# Patient Record
Sex: Female | Born: 1940 | Race: White | Hispanic: No | Marital: Married | Smoking: Never smoker
Health system: Southern US, Community
[De-identification: ages and names within clinical notes are randomized; demographics above are authoritative.]

## PROBLEM LIST (undated history)

## (undated) DIAGNOSIS — I10 Essential (primary) hypertension: Secondary | ICD-10-CM

## (undated) DIAGNOSIS — N200 Calculus of kidney: Secondary | ICD-10-CM

## (undated) HISTORY — PX: ANKLE SURGERY: SHX546

## (undated) HISTORY — DX: Essential (primary) hypertension: I10

## (undated) HISTORY — PX: ABDOMINAL HYSTERECTOMY: SHX81

## (undated) HISTORY — PX: CHOLECYSTECTOMY: SHX55

---

## 1998-02-19 ENCOUNTER — Other Ambulatory Visit: Admission: RE | Admit: 1998-02-19 | Discharge: 1998-02-19 | Payer: Self-pay | Admitting: Obstetrics and Gynecology

## 1998-07-28 ENCOUNTER — Emergency Department (HOSPITAL_COMMUNITY): Admission: EM | Admit: 1998-07-28 | Discharge: 1998-07-28 | Payer: Self-pay | Admitting: Internal Medicine

## 1999-02-07 ENCOUNTER — Ambulatory Visit (HOSPITAL_COMMUNITY): Admission: RE | Admit: 1999-02-07 | Discharge: 1999-02-07 | Payer: Self-pay | Admitting: Family Medicine

## 1999-02-07 ENCOUNTER — Encounter: Payer: Self-pay | Admitting: Family Medicine

## 1999-03-31 ENCOUNTER — Observation Stay (HOSPITAL_COMMUNITY): Admission: RE | Admit: 1999-03-31 | Discharge: 1999-04-01 | Payer: Self-pay | Admitting: Surgery

## 1999-04-21 ENCOUNTER — Emergency Department (HOSPITAL_COMMUNITY): Admission: EM | Admit: 1999-04-21 | Discharge: 1999-04-21 | Payer: Self-pay | Admitting: Emergency Medicine

## 1999-04-21 ENCOUNTER — Encounter: Payer: Self-pay | Admitting: Emergency Medicine

## 1999-05-28 ENCOUNTER — Other Ambulatory Visit: Admission: RE | Admit: 1999-05-28 | Discharge: 1999-05-28 | Payer: Self-pay | Admitting: Obstetrics and Gynecology

## 2000-01-21 ENCOUNTER — Emergency Department (HOSPITAL_COMMUNITY): Admission: EM | Admit: 2000-01-21 | Discharge: 2000-01-21 | Payer: Self-pay | Admitting: Emergency Medicine

## 2000-01-21 ENCOUNTER — Encounter: Payer: Self-pay | Admitting: Emergency Medicine

## 2000-02-29 ENCOUNTER — Encounter: Payer: Self-pay | Admitting: Emergency Medicine

## 2000-02-29 ENCOUNTER — Emergency Department (HOSPITAL_COMMUNITY): Admission: EM | Admit: 2000-02-29 | Discharge: 2000-02-29 | Payer: Self-pay | Admitting: Internal Medicine

## 2000-03-01 ENCOUNTER — Observation Stay (HOSPITAL_COMMUNITY): Admission: AD | Admit: 2000-03-01 | Discharge: 2000-03-02 | Payer: Self-pay | Admitting: Urology

## 2000-08-19 ENCOUNTER — Other Ambulatory Visit: Admission: RE | Admit: 2000-08-19 | Discharge: 2000-08-19 | Payer: Self-pay | Admitting: Obstetrics and Gynecology

## 2000-12-28 ENCOUNTER — Inpatient Hospital Stay (HOSPITAL_COMMUNITY): Admission: RE | Admit: 2000-12-28 | Discharge: 2000-12-30 | Payer: Self-pay | Admitting: Orthopedic Surgery

## 2000-12-28 ENCOUNTER — Encounter: Payer: Self-pay | Admitting: Orthopedic Surgery

## 2001-02-14 ENCOUNTER — Encounter: Admission: RE | Admit: 2001-02-14 | Discharge: 2001-03-10 | Payer: Self-pay | Admitting: Orthopedic Surgery

## 2001-09-02 ENCOUNTER — Other Ambulatory Visit: Admission: RE | Admit: 2001-09-02 | Discharge: 2001-09-02 | Payer: Self-pay | Admitting: Obstetrics and Gynecology

## 2001-09-02 ENCOUNTER — Encounter: Admission: RE | Admit: 2001-09-02 | Discharge: 2001-09-02 | Payer: Self-pay | Admitting: Obstetrics and Gynecology

## 2001-09-02 ENCOUNTER — Encounter: Payer: Self-pay | Admitting: Obstetrics and Gynecology

## 2003-04-06 ENCOUNTER — Ambulatory Visit (HOSPITAL_COMMUNITY): Admission: RE | Admit: 2003-04-06 | Discharge: 2003-04-06 | Payer: Self-pay | Admitting: Family Medicine

## 2003-04-06 ENCOUNTER — Encounter: Payer: Self-pay | Admitting: Family Medicine

## 2003-04-16 ENCOUNTER — Ambulatory Visit (HOSPITAL_COMMUNITY): Admission: RE | Admit: 2003-04-16 | Discharge: 2003-04-16 | Payer: Self-pay | Admitting: *Deleted

## 2003-04-16 ENCOUNTER — Encounter: Payer: Self-pay | Admitting: Family Medicine

## 2010-12-07 ENCOUNTER — Emergency Department (HOSPITAL_COMMUNITY): Payer: Medicare Other

## 2010-12-07 ENCOUNTER — Emergency Department (HOSPITAL_COMMUNITY)
Admission: EM | Admit: 2010-12-07 | Discharge: 2010-12-07 | Disposition: A | Discharge status: Home / Self Care | Payer: Medicare Other | Attending: Emergency Medicine | Admitting: Emergency Medicine

## 2010-12-07 DIAGNOSIS — S8010XA Contusion of unspecified lower leg, initial encounter: Secondary | ICD-10-CM | POA: Insufficient documentation

## 2010-12-07 DIAGNOSIS — M79609 Pain in unspecified limb: Secondary | ICD-10-CM | POA: Insufficient documentation

## 2010-12-07 DIAGNOSIS — Y92009 Unspecified place in unspecified non-institutional (private) residence as the place of occurrence of the external cause: Secondary | ICD-10-CM | POA: Insufficient documentation

## 2010-12-07 DIAGNOSIS — IMO0002 Reserved for concepts with insufficient information to code with codable children: Secondary | ICD-10-CM | POA: Insufficient documentation

## 2010-12-17 ENCOUNTER — Emergency Department (HOSPITAL_COMMUNITY)
Admission: EM | Admit: 2010-12-17 | Discharge: 2010-12-17 | Disposition: A | Discharge status: Home / Self Care | Payer: Medicare Other | Attending: Emergency Medicine | Admitting: Emergency Medicine

## 2010-12-17 DIAGNOSIS — L03119 Cellulitis of unspecified part of limb: Secondary | ICD-10-CM | POA: Insufficient documentation

## 2010-12-17 DIAGNOSIS — S8010XA Contusion of unspecified lower leg, initial encounter: Secondary | ICD-10-CM | POA: Insufficient documentation

## 2010-12-17 DIAGNOSIS — L02419 Cutaneous abscess of limb, unspecified: Secondary | ICD-10-CM | POA: Insufficient documentation

## 2010-12-17 DIAGNOSIS — W208XXA Other cause of strike by thrown, projected or falling object, initial encounter: Secondary | ICD-10-CM | POA: Insufficient documentation

## 2010-12-17 LAB — CBC
Hemoglobin: 11.7 g/dL — ABNORMAL LOW (ref 12.0–15.0)
MCV: 89.9 fL (ref 78.0–100.0)
RBC: 4.07 MIL/uL (ref 3.87–5.11)
WBC: 4.7 10*3/uL (ref 4.0–10.5)

## 2010-12-17 LAB — DIFFERENTIAL
Basophils Relative: 1 % (ref 0–1)
Lymphocytes Relative: 31 % (ref 12–46)
Lymphs Abs: 1.5 10*3/uL (ref 0.7–4.0)
Monocytes Relative: 11 % (ref 3–12)
Neutro Abs: 2.6 10*3/uL (ref 1.7–7.7)

## 2012-05-30 IMAGING — CR DG TIBIA/FIBULA 2V*R*
4 series · 4 of 4 positions shown · non-contrast
Comparison: None.

CLINICAL DATA: Trauma, pain

RIGHT TIBIA AND FIBULA - 2 VIEW

[t tib/fib ap right (1 of 2)]
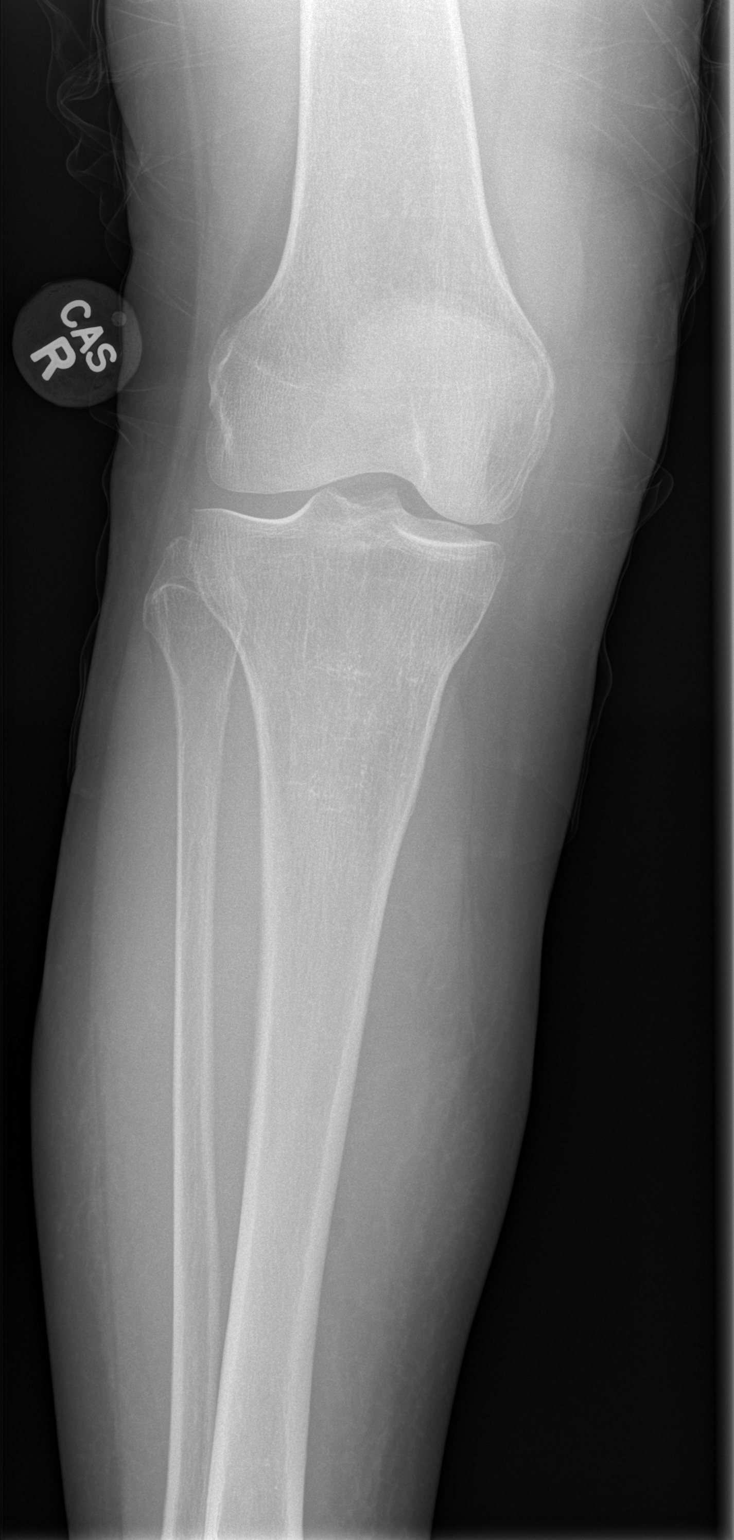

[t tib/fib lat right (1 of 2)]
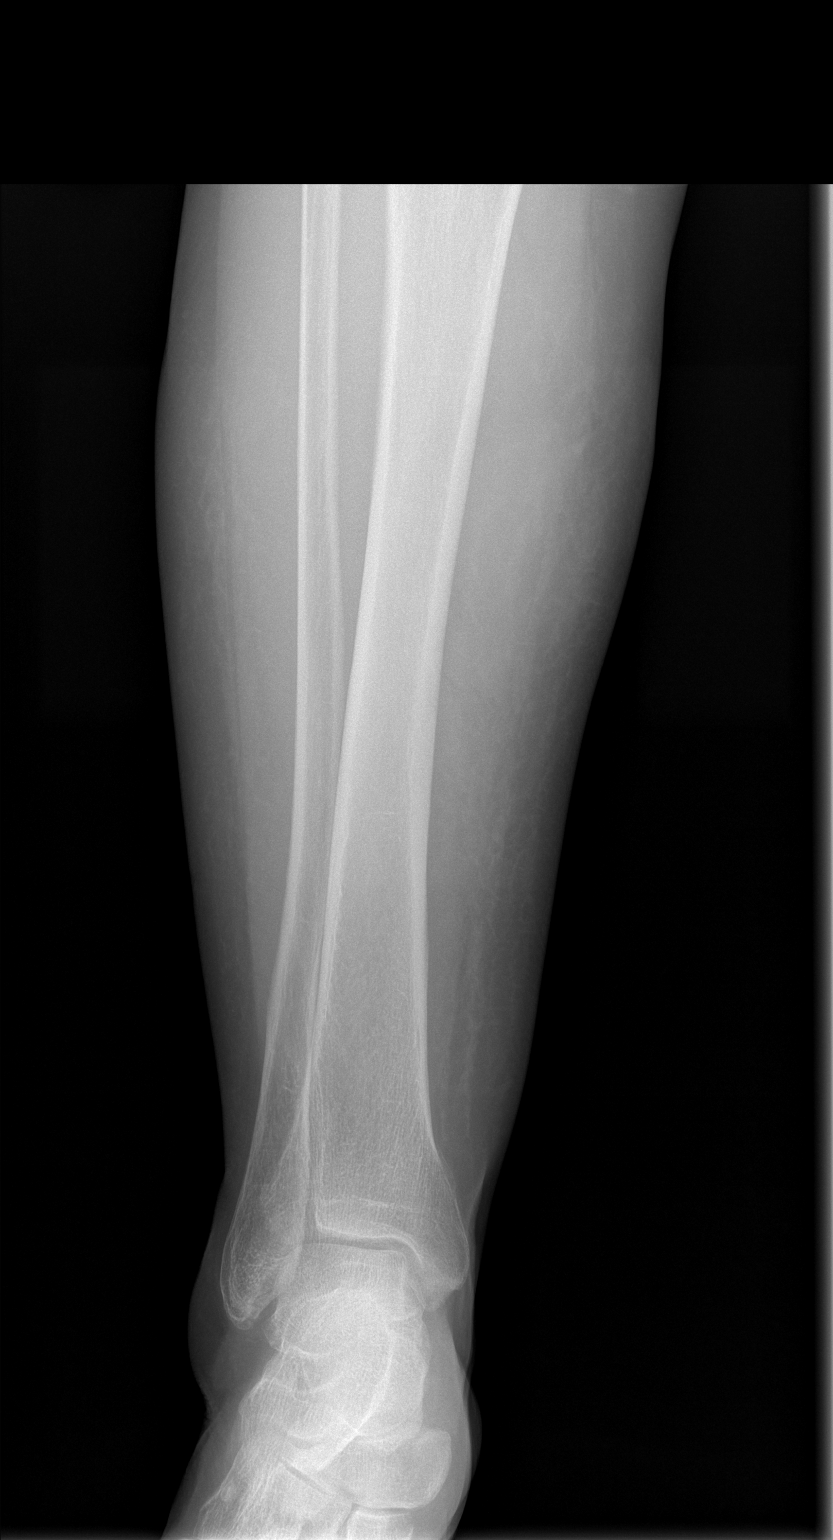

[t tib/fib ap right (2 of 2)]
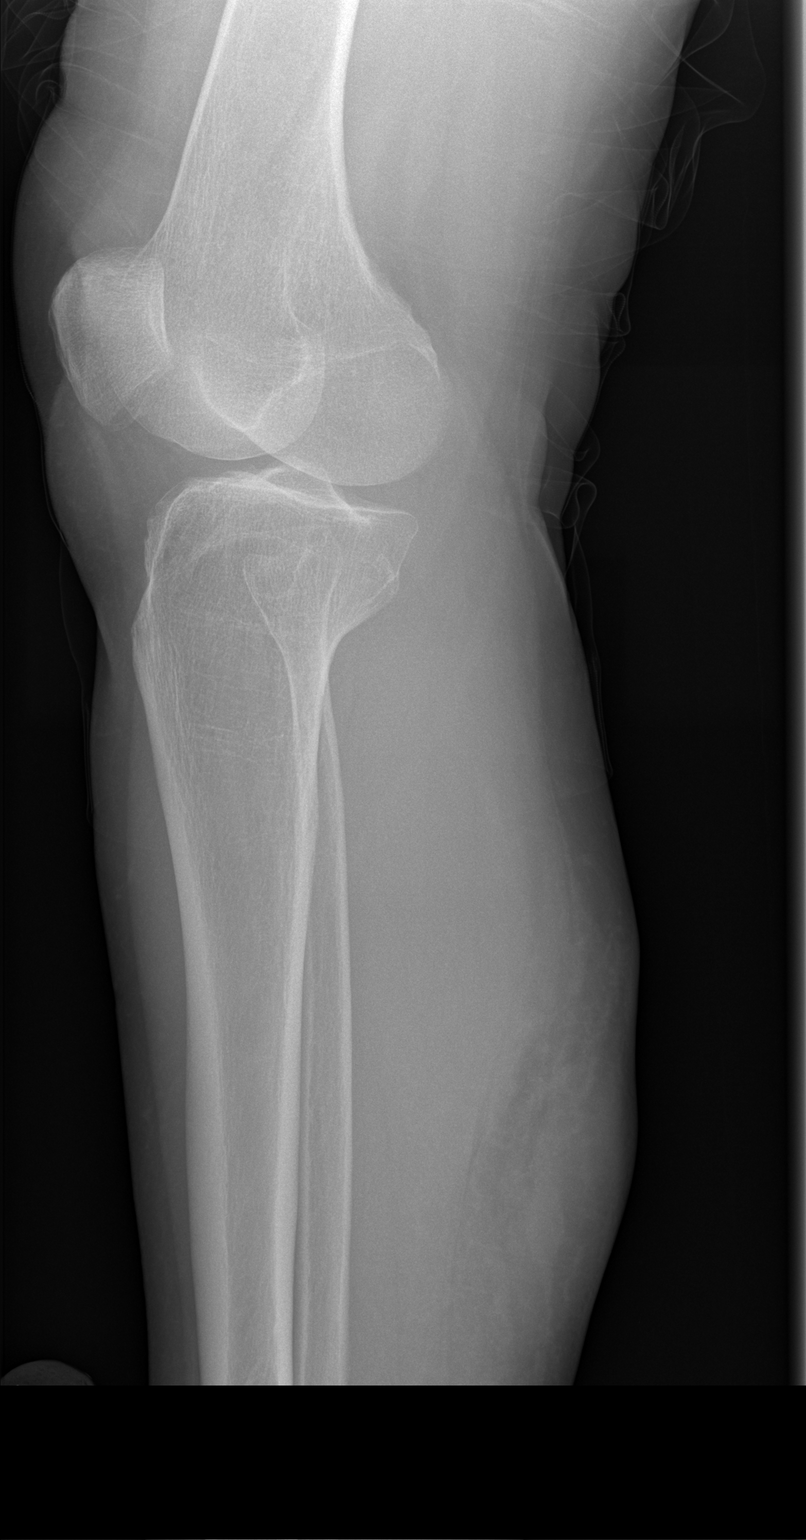

[t tib/fib lat right (2 of 2)]
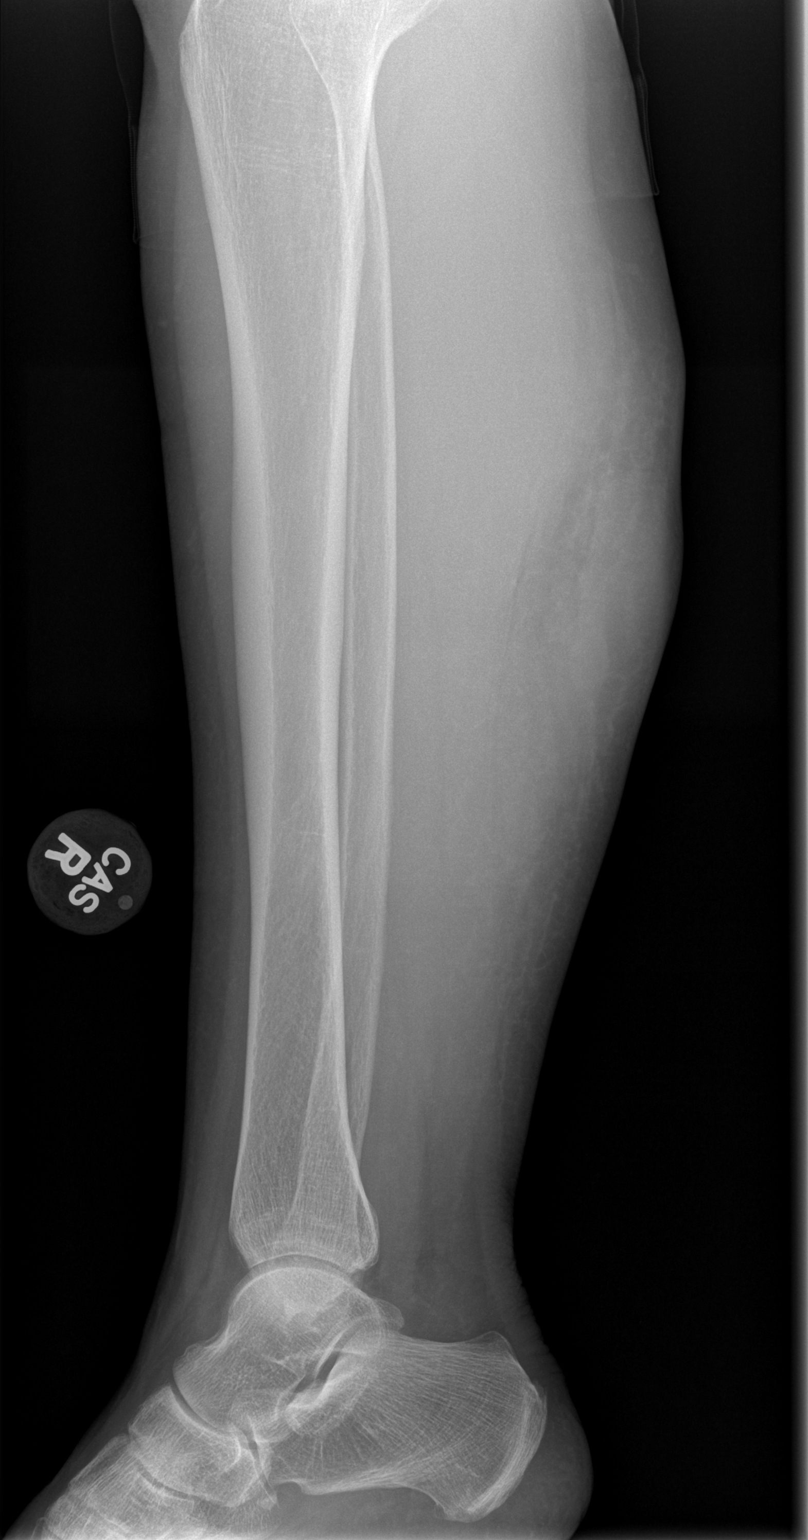

[4 of 4 positions shown; findings below may reference images not displayed]

FINDINGS: Soft tissue swelling noted.  Intact right tibia and
fibula.  Normal alignment.  No displaced fracture.
IMPRESSION: Soft tissue swelling.  No acute osseous finding

## 2014-08-28 ENCOUNTER — Emergency Department (HOSPITAL_COMMUNITY)
Admission: EM | Admit: 2014-08-28 | Discharge: 2014-08-28 | Disposition: A | Discharge status: Home / Self Care | Payer: Medicare Other | Attending: Emergency Medicine | Admitting: Emergency Medicine

## 2014-08-28 ENCOUNTER — Encounter (HOSPITAL_COMMUNITY): Payer: Self-pay

## 2014-08-28 DIAGNOSIS — Z9049 Acquired absence of other specified parts of digestive tract: Secondary | ICD-10-CM | POA: Diagnosis not present

## 2014-08-28 DIAGNOSIS — R42 Dizziness and giddiness: Secondary | ICD-10-CM | POA: Diagnosis present

## 2014-08-28 DIAGNOSIS — R531 Weakness: Secondary | ICD-10-CM | POA: Diagnosis not present

## 2014-08-28 DIAGNOSIS — Z87442 Personal history of urinary calculi: Secondary | ICD-10-CM | POA: Insufficient documentation

## 2014-08-28 HISTORY — DX: Calculus of kidney: N20.0

## 2014-08-28 LAB — TROPONIN I: Troponin I: <0.03 ng/mL (ref <0.031)

## 2014-08-28 LAB — URINE MICROSCOPIC-ADD ON

## 2014-08-28 LAB — CBC WITH DIFFERENTIAL/PLATELET
BASOS ABS: 0 10*3/uL (ref 0.0–0.1)
Basophils Relative: 0 % (ref 0–1)
Eosinophils Absolute: 0 10*3/uL (ref 0.0–0.7)
Eosinophils Relative: 0 % (ref 0–5)
HCT: 42.8 % (ref 36.0–46.0)
HEMOGLOBIN: 14 g/dL (ref 12.0–15.0)
LYMPHS PCT: 8 % — AB (ref 12–46)
Lymphs Abs: 0.6 10*3/uL — ABNORMAL LOW (ref 0.7–4.0)
MCH: 29.7 pg (ref 26.0–34.0)
MCHC: 32.7 g/dL (ref 30.0–36.0)
MCV: 90.9 fL (ref 78.0–100.0)
Monocytes Absolute: 0.5 10*3/uL (ref 0.1–1.0)
Monocytes Relative: 7 % (ref 3–12)
NEUTROS PCT: 85 % — AB (ref 43–77)
Neutro Abs: 6.8 10*3/uL (ref 1.7–7.7)
Platelets: 217 10*3/uL (ref 150–400)
RBC: 4.71 MIL/uL (ref 3.87–5.11)
RDW: 13.8 % (ref 11.5–15.5)
WBC: 8 10*3/uL (ref 4.0–10.5)

## 2014-08-28 LAB — URINALYSIS, ROUTINE W REFLEX MICROSCOPIC
Bilirubin Urine: NEGATIVE
Glucose, UA: NEGATIVE mg/dL
Hgb urine dipstick: NEGATIVE
Ketones, ur: NEGATIVE mg/dL
NITRITE: NEGATIVE
PH: 8 (ref 5.0–8.0)
Protein, ur: NEGATIVE mg/dL
Specific Gravity, Urine: 1.01 (ref 1.005–1.030)
Urobilinogen, UA: 0.2 mg/dL (ref 0.0–1.0)

## 2014-08-28 LAB — COMPREHENSIVE METABOLIC PANEL
ALT: 21 U/L (ref 0–35)
AST: 25 U/L (ref 0–37)
Albumin: 4.2 g/dL (ref 3.5–5.2)
Alkaline Phosphatase: 101 U/L (ref 39–117)
Anion gap: 8 (ref 5–15)
BUN: 13 mg/dL (ref 6–23)
CO2: 22 mmol/L (ref 19–32)
CREATININE: 0.89 mg/dL (ref 0.50–1.10)
Calcium: 8.8 mg/dL (ref 8.4–10.5)
Chloride: 108 mmol/L (ref 96–112)
GFR calc Af Amer: 73 mL/min — ABNORMAL LOW (ref >90)
GFR, EST NON AFRICAN AMERICAN: 63 mL/min — AB (ref >90)
GLUCOSE: 156 mg/dL — AB (ref 70–99)
Potassium: 3.7 mmol/L (ref 3.5–5.1)
Sodium: 138 mmol/L (ref 135–145)
TOTAL PROTEIN: 7.3 g/dL (ref 6.0–8.3)
Total Bilirubin: 0.6 mg/dL (ref 0.3–1.2)

## 2014-08-28 MED ORDER — DIPHENHYDRAMINE HCL 50 MG/ML IJ SOLN
12.5000 mg | Freq: Once | INTRAMUSCULAR | Status: AC
  Administered 2014-08-28: 12.5 mg via INTRAVENOUS
  Filled 2014-08-28: qty 1

## 2014-08-28 MED ORDER — MECLIZINE HCL 25 MG PO TABS: 25.0000 mg | ORAL_TABLET | Freq: Four times a day (QID) | ORAL | Status: DC

## 2014-08-28 MED ORDER — METOCLOPRAMIDE HCL 5 MG/ML IJ SOLN
5.0000 mg | Freq: Once | INTRAMUSCULAR | Status: AC
  Administered 2014-08-28: 5 mg via INTRAVENOUS
  Filled 2014-08-28: qty 2

## 2014-08-28 MED ORDER — SODIUM CHLORIDE 0.9 % IV BOLUS (SEPSIS)
1000.0000 mL | Freq: Once | INTRAVENOUS | Status: AC
  Administered 2014-08-28: 1000 mL via INTRAVENOUS

## 2014-08-28 MED ORDER — LORAZEPAM 2 MG/ML IJ SOLN
0.5000 mg | Freq: Once | INTRAMUSCULAR | Status: AC
  Administered 2014-08-28: 0.5 mg via INTRAVENOUS
  Filled 2014-08-28: qty 1

## 2014-08-28 MED ORDER — SODIUM CHLORIDE 0.9 % IV SOLN
INTRAVENOUS | Status: DC
  Administered 2014-08-28: 09:00:00 via INTRAVENOUS

## 2014-08-28 NOTE — ED Notes (Signed)
Bed: WA23 Expected date:  Expected time:  Means of arrival:  Comments: EMS-weak 

## 2014-08-28 NOTE — ED Notes (Signed)
Per EMS, pt from home.  Pt got up this am to take a shower.  Pt came back to bed and had a generalized weakness, flushed feeling and vertigo.  Pt had no fall.  Alert and oriented.  Neuro assessment wnl by EMS.  All symptoms started 35 minutes ago.  No unilateral deficits.  Speech clear.  No numbness/tingling.  C/O lightheaded and dizzy.  Nausea dry heave.  20g Rt hand.  cbg 171, 190/103 - now 148/72, hr 92, resp 20, 4l per [place] sats 95%.

## 2014-08-28 NOTE — ED Provider Notes (Signed)
CSN: [id number]     Arrival date & time 08/28/14  4098 History   First MD Initiated Contact with Patient 08/28/14 0827     Chief Complaint  Patient presents with  . Weakness  . Dizziness     (Consider location/radiation/quality/duration/timing/severity/associated sxs/prior Treatment) HPI Comments: Patient with acute onset of dizziness and weakness that began this morning which went to stand up. Was at her baseline state of health yesterday. Today she denies any associated chest pain or shortness of breath. No abdominal pain. No headaches. No confusion. Denies any unilateral weakness. No black or bloody stools. No prior history of same. No recent URI symptoms. Symptoms better with sitting. No prior history of same. Patient does not take any regular medications. Symptoms are slightly worse with head movement and she denies any ear pain. EMS called and patient transported here  Patient is a 74 y.o. female presenting with weakness and dizziness. The history is provided by the patient and a relative.  Weakness  Dizziness   Past Medical History  Diagnosis Date  . Kidney stones    Past Surgical History  Procedure Laterality Date  . Cholecystectomy    . Ankle surgery    . Abdominal hysterectomy     History reviewed. No pertinent family history. History  Substance Use Topics  . Smoking status: Never Smoker   . Smokeless tobacco: Not on file  . Alcohol Use: No   OB History    No data available     Review of Systems  Neurological: Positive for dizziness and weakness.  All other systems reviewed and are negative.     Allergies  Review of patient's allergies indicates no known allergies.  Home Medications   Prior to Admission medications   Not on File   BP 126/86 mmHg  Pulse 86  Temp(Src) 98.1 F (36.7 C) (Oral)  Resp 21  SpO2 97% Physical Exam  Constitutional: She is oriented to person, place, and time. She appears well-developed and well-nourished.  Non-toxic  appearance. No distress.  HENT:  Head: Normocephalic and atraumatic.  Eyes: Conjunctivae, EOM and lids are normal. Pupils are equal, round, and reactive to light.  Neck: Normal range of motion. Neck supple. No tracheal deviation present. No thyroid mass present.  Cardiovascular: Normal rate, regular rhythm and normal heart sounds.  Exam reveals no gallop.   No murmur heard. Pulmonary/Chest: Effort normal and breath sounds normal. No stridor. No respiratory distress. She has no decreased breath sounds. She has no wheezes. She has no rhonchi. She has no rales.  Abdominal: Soft. Normal appearance and bowel sounds are normal. She exhibits no distension. There is no tenderness. There is no rebound and no CVA tenderness.  Musculoskeletal: Normal range of motion. She exhibits no edema or tenderness.  Neurological: She is alert and oriented to person, place, and time. She has normal strength. No cranial nerve deficit or sensory deficit. Coordination and gait normal. GCS eye subscore is 4. GCS verbal subscore is 5. GCS motor subscore is 6.  Horizontal nystagmus noted bilateral  Skin: Skin is warm and dry. No abrasion and no rash noted.  Psychiatric: She has a normal mood and affect. Her speech is normal and behavior is normal.  Nursing note and vitals reviewed.   ED Course  Procedures (including critical care time) Labs Review Labs Reviewed  CBC WITH DIFFERENTIAL/PLATELET  COMPREHENSIVE METABOLIC PANEL  TROPONIN I  URINALYSIS, ROUTINE W REFLEX MICROSCOPIC    Imaging Review No results found.   EKG Interpretation  Date/Time:  Tuesday August 28 2014 08:23:00 EST Ventricular Rate:  97 PR Interval:  148 QRS Duration: 96 QT Interval:  374 QTC Calculation: 475 R Axis:   20 Text Interpretation:  Sinus rhythm No significant change since last  tracing Confirmed by Jaiyon Wander  MD, Ekta Dancer ([id number]) on 08/28/2014 10:43:49 AM      MDM   Final diagnoses:  None    Patient given meds here for  vertigo. Patient feels better. Mild hypertension noted and patient to follow-up with her primary care doctor. No concern for central vertigo    Toy BakerAnthony T Nyheem Binette, MD 08/28/14 1520

## 2014-08-28 NOTE — Discharge Instructions (Signed)
Benign Positional Vertigo Vertigo means you feel like you or your surroundings are moving when they are not. Benign positional vertigo is the most common form of vertigo. Benign means that the cause of your condition is not serious. Benign positional vertigo is more common in older adults. CAUSES  Benign positional vertigo is the result of an upset in the labyrinth system. This is an area in the middle ear that helps control your balance. This may be caused by a viral infection, head injury, or repetitive motion. However, often no specific cause is found. SYMPTOMS  Symptoms of benign positional vertigo occur when you move your head or eyes in different directions. Some of the symptoms may include:  Loss of balance and falls.  Vomiting.  Blurred vision.  Dizziness.  Nausea.  Involuntary eye movements (nystagmus). DIAGNOSIS  Benign positional vertigo is usually diagnosed by physical exam. If the specific cause of your benign positional vertigo is unknown, your caregiver may perform imaging tests, such as magnetic resonance imaging (MRI) or computed tomography (CT). TREATMENT  Your caregiver may recommend movements or procedures to correct the benign positional vertigo. Medicines such as meclizine, benzodiazepines, and medicines for nausea may be used to treat your symptoms. In rare cases, if your symptoms are caused by certain conditions that affect the inner ear, you may need surgery. HOME CARE INSTRUCTIONS   Follow your caregiver's instructions.  Move slowly. Do not make sudden body or head movements.  Avoid driving.  Avoid operating heavy machinery.  Avoid performing any tasks that would be dangerous to you or others during a vertigo episode.  Drink enough fluids to keep your urine clear or pale yellow. SEEK IMMEDIATE MEDICAL CARE IF:   You develop problems with walking, weakness, numbness, or using your arms, hands, or legs.  You have difficulty speaking.  You develop  severe headaches.  Your nausea or vomiting continues or gets worse.  You develop visual changes.  Your family or friends notice any behavioral changes.  Your condition gets worse.  You have a fever.  You develop a stiff neck or sensitivity to light. MAKE SURE YOU:   Understand these instructions.  Will watch your condition.  Will get help right away if you are not doing well or get worse. Document Released: 04/27/2006 Document Revised: 10/12/2011 Document Reviewed: 04/09/2011 ExitCare Patient Information 2015 [employer]. This information is not intended to replace advice given to you by your health care provider. Make sure you discuss any questions you have with your health care provider.    

## 2019-05-08 DIAGNOSIS — M25572 Pain in left ankle and joints of left foot: Secondary | ICD-10-CM | POA: Diagnosis not present

## 2019-05-08 DIAGNOSIS — Z9889 Other specified postprocedural states: Secondary | ICD-10-CM | POA: Diagnosis not present

## 2019-11-07 MED ORDER — ATORVASTATIN CALCIUM 40 MG PO TABS
40.00 | ORAL_TABLET | ORAL | Status: DC
Start: 2019-11-08 — End: 2019-11-07

## 2019-12-15 DIAGNOSIS — Z1231 Encounter for screening mammogram for malignant neoplasm of breast: Secondary | ICD-10-CM | POA: Diagnosis not present

## 2019-12-15 DIAGNOSIS — I10 Essential (primary) hypertension: Secondary | ICD-10-CM | POA: Diagnosis not present

## 2019-12-15 DIAGNOSIS — Z13820 Encounter for screening for osteoporosis: Secondary | ICD-10-CM | POA: Diagnosis not present

## 2019-12-15 DIAGNOSIS — Z0000 Encounter for general adult medical examination without abnormal findings: Secondary | ICD-10-CM | POA: Diagnosis not present

## 2019-12-15 DIAGNOSIS — H4921 Sixth [abducent] nerve palsy, right eye: Secondary | ICD-10-CM | POA: Diagnosis not present

## 2019-12-21 ENCOUNTER — Other Ambulatory Visit: Payer: Self-pay | Admitting: Physician Assistant

## 2019-12-21 DIAGNOSIS — Z13820 Encounter for screening for osteoporosis: Secondary | ICD-10-CM

## 2019-12-21 DIAGNOSIS — Z1231 Encounter for screening mammogram for malignant neoplasm of breast: Secondary | ICD-10-CM

## 2020-01-12 DIAGNOSIS — H4921 Sixth [abducent] nerve palsy, right eye: Secondary | ICD-10-CM | POA: Diagnosis not present

## 2020-07-31 ENCOUNTER — Other Ambulatory Visit: Payer: Medicare Other

## 2020-07-31 DIAGNOSIS — Z20822 Contact with and (suspected) exposure to covid-19: Secondary | ICD-10-CM | POA: Diagnosis not present

## 2020-08-01 LAB — NOVEL CORONAVIRUS, NAA: SARS-CoV-2, NAA: DETECTED — AB

## 2020-08-01 LAB — SARS-COV-2, NAA 2 DAY TAT

## 2020-08-05 ENCOUNTER — Ambulatory Visit: Payer: Self-pay | Admitting: *Deleted

## 2020-08-05 NOTE — Telephone Encounter (Signed)
Caller requesting if she can be retested prior to returning to work after being positive for covid on 07/31/20. Patient reports she is asymptomatic and has quarantined for 5 days. Reviewed up dated CDC guidelines for isolation with patient. Patient asking if she can test positive up to 90 days for covid. Information given to patient that she can test positive up to 90 days. Denies symptoms and verbalized understanding she is to wear a mask for 5 additional days . Care advise given. Patient verbalized understanding of care advise and to call back if needed or call her PCP for more advise .   Reason for Disposition . General information question, no triage required and triager able to answer question  Answer Assessment - Initial Assessment Questions 1. 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  Protocols used: INFORMATION ONLY CALL - NO TRIAGE-A-AH

## 2020-08-07 ENCOUNTER — Other Ambulatory Visit: Payer: Self-pay

## 2021-03-03 ENCOUNTER — Other Ambulatory Visit (HOSPITAL_COMMUNITY): Payer: Self-pay | Admitting: Physician Assistant

## 2021-03-03 DIAGNOSIS — N644 Mastodynia: Secondary | ICD-10-CM

## 2021-03-03 DIAGNOSIS — Z1231 Encounter for screening mammogram for malignant neoplasm of breast: Secondary | ICD-10-CM

## 2021-04-01 ENCOUNTER — Ambulatory Visit (HOSPITAL_COMMUNITY)
Admission: RE | Admit: 2021-04-01 | Discharge: 2021-04-01 | Disposition: A | Discharge status: Home / Self Care | Payer: Medicare Other | Source: Ambulatory Visit | Attending: Physician Assistant | Admitting: Physician Assistant

## 2021-04-01 ENCOUNTER — Other Ambulatory Visit: Payer: Self-pay

## 2021-04-01 ENCOUNTER — Encounter (HOSPITAL_COMMUNITY): Payer: Self-pay

## 2021-04-01 DIAGNOSIS — N644 Mastodynia: Secondary | ICD-10-CM

## 2022-03-25 NOTE — Progress Notes (Signed)
Cardiology Office Note:    Date:  04/02/2022   ID:  AMBERLYNN TEMPESTA, DOB 1940-09-23, MRN [id number]  PCP:  Aviva Kluver   [facility] Providers Cardiologist:  Selwyn Reason [place], MD     Referring MD: Mattie Marlin, DO   Chief Complaint  Patient presents with   Chest Pain   Dizziness    History of Present Illness:    Maureen Jimenez is a 81 y.o. female is seen at the request of Dr Vivi Ferns for evaluation of lightheadedness and chest pain. I take care of her husband Buddy. She in general has been in excellent health. Was apparently on BP medication in the past but this was stopped. Recently she has experienced symptoms of lightheadedness. This may occur 2-3 times a week. May occur anytime. Lasts several seconds. Has not passed out. No palpitations. In addition she has experienced symptoms of chest pain. This begins in the mid chest and radiates into the back and arms R>L. Described as sharp. No SOB. Does have occasional cough. Family history is positive for stroke and some heart disease. Has 12 brothers and sisters.   Past Medical History:  Diagnosis Date   Hypertension    Kidney stones     Past Surgical History:  Procedure Laterality Date   ABDOMINAL HYSTERECTOMY     ANKLE SURGERY     CHOLECYSTECTOMY      Current Medications: Current Meds  Medication Sig   metoprolol tartrate (LOPRESSOR) 100 MG tablet Take 2 hours prior to CT     Allergies:   Patient has no known allergies.   Social History   Socioeconomic History   Marital status: Married    Spouse name: Not on file   Number of children: 2   Years of education: Not on file   Highest education level: Not on file  Occupational History   Not on file  Tobacco Use   Smoking status: Never   Smokeless tobacco: Never  Substance and Sexual Activity   Alcohol use: No   Drug use: No   Sexual activity: Not on file  Other Topics Concern   Not on file  Social History Narrative   Not on file   Social Determinants of  Health   Financial Resource Strain: Not on file  Food Insecurity: Not on file  Transportation Needs: Not on file  Physical Activity: Not on file  Stress: Not on file  Social Connections: Not on file     Family History: The patient's family history includes Breast cancer in her sister; Cancer - Lung in her brother; Heart failure in her mother; Stroke in her brother, brother, and father.  ROS:   Please see the history of present illness.     All other systems reviewed and are negative.  EKGs/Labs/Other Studies Reviewed:    The following studies were reviewed today: none  EKG:  EKG is  ordered today.  The ekg ordered today demonstrates NSR rate 82. Minor Nonspecific ST abnormality. I have personally reviewed and interpreted this study.   Recent Labs: No results found for requested labs within last 365 days.  Recent Lipid Panel No results found for: "CHOL", "TRIG", "HDL", "CHOLHDL", "VLDL", "LDLCALC", "LDLDIRECT"  Dated 03/03/22: cholesterol 139, triglycerides 41, HDL 64, LDL 64. A1c 5.8%. CMET, CBC, TSH normal.  Risk Assessment/Calculations:      HYPERTENSION CONTROL Vitals:   04/02/22 0938 04/02/22 1014  BP: (!) 152/84 (!) 144/80    The patient's blood pressure is elevated above  target today.  In order to address the patient's elevated BP: Blood pressure will be monitored at home to determine if medication changes need to be made.            Physical Exam:    VS:  BP (!) 144/80   Pulse 82   Ht 5\' 4"  (1.626 m)   Wt 176 lb (79.8 kg)   SpO2 96%   BMI 30.21 kg/m     Wt Readings from Last 3 Encounters:  04/02/22 176 lb (79.8 kg)     GEN:  Well nourished, well developed in no acute distress HEENT: Normal NECK: No JVD; No carotid bruits LYMPHATICS: No lymphadenopathy CARDIAC: RRR, no murmurs, rubs, gallops RESPIRATORY:  Clear to auscultation without rales, wheezing or rhonchi  ABDOMEN: Soft, non-tender, non-distended MUSCULOSKELETAL:  No edema; No deformity   SKIN: Warm and dry NEUROLOGIC:  Alert and oriented x 3 PSYCHIATRIC:  Normal affect   ASSESSMENT:    1. Lightheadedness   2. Chest pain of uncertain etiology   3. Pre-procedure lab exam    PLAN:    In order of problems listed above:  Lightheadedness. Labs and Ecg are ok. Need to make sure this is not related to arrhythmia. Will arrange for a 2 week Zio patch monitor. Chest pain. Etiology unclear. Recommend ischemic evaluation. Discussed modalities including stress testing versus coronary CTA. Will proceed with CTA.     Will follow up after above studies.        Medication Adjustments/Labs and Tests Ordered: Current medicines are reviewed at length with the patient today.  Concerns regarding medicines are outlined above.  Orders Placed This Encounter  Procedures   CT CORONARY MORPH W/CTA COR W/SCORE W/CA W/CM &/OR WO/CM   Basic Metabolic Panel (BMET)   LONG TERM MONITOR (3-14 DAYS)   Meds ordered this encounter  Medications   metoprolol tartrate (LOPRESSOR) 100 MG tablet    Sig: Take 2 hours prior to CT    Dispense:  1 tablet    Refill:  0    Patient Instructions  Medication Instructions:  Your physician recommends that you continue on your current medications as directed. Please refer to the Current Medication list given to you today.  *If you need a refill on your cardiac medications before your next appointment, please call your pharmacy*   Lab Work: TODAY: BMET If you have labs (blood work) drawn today and your tests are completely normal, you will receive your results only by: MyChart Message (if you have MyChart) OR A paper copy in the mail If you have any lab test that is abnormal or we need to change your treatment, we will call you to review the results.   Testing/Procedures: 04/04/22- Long Term Monitor Instructions  Your physician has requested you wear a ZIO patch monitor for 14 days.  This is a single patch monitor. [employer] supplies one patch monitor  per enrollment. Additional stickers are not available. Please do not apply patch if you will be having a Nuclear Stress Test,  Echocardiogram, Cardiac CT, MRI, or Chest Xray during the period you would be wearing the  monitor. The patch cannot be worn during these tests. You cannot remove and re-apply the  ZIO XT patch monitor.  Your ZIO patch monitor will be mailed 3 day USPS to your address on file. It may take 3-5 days  to receive your monitor after you have been enrolled.  Once you have received your monitor, please review the enclosed  instructions. Your monitor  has already been registered assigning a specific monitor serial # to you.  Billing and Patient Assistance Program Information  We have supplied [employer] with any of your insurance information on file for billing purposes. [employer] offers a sliding scale Patient Assistance Program for patients that do not have  insurance, or whose insurance does not completely cover the cost of the ZIO monitor.  You must apply for the Patient Assistance Program to qualify for this discounted rate.  To apply, please call [employer] at [phone], select option 4, select option 2, ask to apply for  Patient Assistance Program. Meredeth Ide will ask your household income, and how many people  are in your household. They will quote your out-of-pocket cost based on that information.  [employer] will also be able to set up a 1-month, interest-free payment plan if needed.  Applying the monitor   Shave hair from upper left chest.  Hold abrader disc by orange tab. Rub abrader in 40 strokes over the upper left chest as  indicated in your monitor instructions.  Clean area with 4 enclosed alcohol pads. Let dry.  Apply patch as indicated in monitor instructions. Patch will be placed under collarbone on left  side of chest with arrow pointing upward.  Rub patch adhesive wings for 2 minutes. Remove white label marked "1". Remove the white  label marked "2". Rub patch  adhesive wings for 2 additional minutes.  While looking in a mirror, press and release button in center of patch. A small green light will  flash 3-4 times. This will be your only indicator that the monitor has been turned on.  Do not shower for the first 24 hours. You may shower after the first 24 hours.  Press the button if you feel a symptom. You will hear a small click. Record Date, Time and  Symptom in the Patient Logbook.  When you are ready to remove the patch, follow instructions on the last 2 pages of Patient  Logbook. Stick patch monitor onto the last page of Patient Logbook.  Place Patient Logbook in the blue and white box. Use locking tab on box and tape box closed  securely. The blue and white box has prepaid postage on it. Please place it in the mailbox as  soon as possible. Your physician should have your test results approximately 7 days after the  monitor has been mailed back to [facility].  Call [facility] Customer Care at [phone] if you have questions regarding  your ZIO XT patch monitor. Call them immediately if you see an orange light blinking on your  monitor.  If your monitor falls off in less than 4 days, contact our Monitor department at [phone].  If your monitor becomes loose or falls off after 4 days call [employer] at [phone] for  suggestions on securing your monitor    Your cardiac CT will be scheduled at one of the below locations:   [facility] [address] [place], [place] [address] [phone]  If scheduled at [facility], please arrive at the [facility] and Children's Entrance (Entrance C2) of [facility] 30 minutes prior to test start time. You can use the FREE valet parking offered at entrance C (encouraged to control the heart rate for the test)  Proceed to the [facility] Radiology Department (first floor) to check-in and test prep.  All radiology patients and guests should use entrance C2 at  [facility], accessed from [facility],  even though the hospital's physical address listed is [address]     Please follow these instructions carefully (unless otherwise directed):   On the Night Before the Test: Be sure to Drink plenty of water. Do not consume any caffeinated/decaffeinated beverages or chocolate 12 hours prior to your test. Do not take any antihistamines 12 hours prior to your test.  On the Day of the Test: Drink plenty of water until 1 hour prior to the test. Do not eat any food 4 hours prior to the test. You may take your regular medications prior to the test.  Take metoprolol (Lopressor) two hours prior to test. HOLD Furosemide/Hydrochlorothiazide morning of the test. FEMALES- please wear underwire-free bra if available, avoid dresses & tight clothing      After the Test: Drink plenty of water. After receiving IV contrast, you may experience a mild flushed feeling. This is normal. On occasion, you may experience a mild rash up to 24 hours after the test. This is not dangerous. If this occurs, you can take Benadryl 25 mg and increase your fluid intake. If you experience trouble breathing, this can be serious. If it is severe call 911 IMMEDIATELY. If it is mild, please call our office. If you take any of these medications: Glipizide/Metformin, Avandament, Glucavance, please do not take 48 hours after completing test unless otherwise instructed.  We will call to schedule your test 2-4 weeks out understanding that some insurance companies will need an authorization prior to the service being performed.   For non-scheduling related questions, please contact the cardiac imaging nurse navigator should you have any questions/concerns: Rockwell Alexandria, Cardiac Imaging Nurse Navigator Larey Brick, Cardiac Imaging Nurse Navigator [facility] Direct Office Dial: [phone]   For scheduling needs, including  cancellations and rescheduling, please call [place], [phone].    Follow-Up: At [facility], you and your health needs are our priority.  As part of our continuing mission to provide you with exceptional heart care, we have created designated Provider Care Teams.  These Care Teams include your primary Cardiologist (physician) and Advanced Practice Providers (APPs -  Physician Assistants and Nurse Practitioners) who all work together to provide you with the care you need, when you need it.  We recommend signing up for the patient portal called "MyChart".  Sign up information is provided on this After Visit Summary.  MyChart is used to connect with patients for Virtual Visits (Telemedicine).  Patients are able to view lab/test results, encounter notes, upcoming appointments, etc.  Non-urgent messages can be sent to your provider as well.   To learn more about what you can do with MyChart, go to [email or web address].    Your next appointment:   After CT scan  The format for your next appointment:   In Person  Provider:   Cena Bruhn [place], MD     Other Instructions   Important Information About Sugar         Signed, Azar South [place], MD  04/02/2022 10:14 AM    [facility]

## 2022-04-02 ENCOUNTER — Encounter: Payer: Self-pay | Admitting: Cardiology

## 2022-04-02 ENCOUNTER — Ambulatory Visit: Payer: Medicare Other | Attending: Cardiology | Admitting: Cardiology

## 2022-04-02 ENCOUNTER — Ambulatory Visit (INDEPENDENT_AMBULATORY_CARE_PROVIDER_SITE_OTHER): Payer: Medicare Other

## 2022-04-02 VITALS — BP 144/80 | HR 82 | Ht 64.0 in | Wt 176.0 lb

## 2022-04-02 DIAGNOSIS — R079 Chest pain, unspecified: Secondary | ICD-10-CM | POA: Diagnosis not present

## 2022-04-02 DIAGNOSIS — R42 Dizziness and giddiness: Secondary | ICD-10-CM

## 2022-04-02 DIAGNOSIS — Z01812 Encounter for preprocedural laboratory examination: Secondary | ICD-10-CM

## 2022-04-02 LAB — BASIC METABOLIC PANEL
BUN/Creatinine Ratio: 12 (ref 12–28)
BUN: 12 mg/dL (ref 8–27)
CO2: 28 mmol/L (ref 20–29)
Calcium: 9.8 mg/dL (ref 8.7–10.3)
Chloride: 104 mmol/L (ref 96–106)
Creatinine, Ser: 1.01 mg/dL — ABNORMAL HIGH (ref 0.57–1.00)
Glucose: 111 mg/dL — ABNORMAL HIGH (ref 70–99)
Potassium: 5 mmol/L (ref 3.5–5.2)
Sodium: 141 mmol/L (ref 134–144)
eGFR: 56 mL/min/{1.73_m2} — ABNORMAL LOW (ref >59)

## 2022-04-02 MED ORDER — METOPROLOL TARTRATE 100 MG PO TABS: ORAL_TABLET | ORAL | 0 refills | Status: DC

## 2022-04-02 NOTE — Progress Notes (Unsigned)
Enrolled for [employer] to mail a ZIO XT long term holter monitor to the patients address on file.  

## 2022-04-02 NOTE — Patient Instructions (Addendum)
Medication Instructions:  Your physician recommends that you continue on your current medications as directed. Please refer to the Current Medication list given to you today.  *If you need a refill on your cardiac medications before your next appointment, please call your pharmacy*   Lab Work: TODAY: BMET If you have labs (blood work) drawn today and your tests are completely normal, you will receive your results only by: MyChart Message (if you have MyChart) OR A paper copy in the mail If you have any lab test that is abnormal or we need to change your treatment, we will call you to review the results.   Testing/Procedures: Christena Deem- Long Term Monitor Instructions  Your physician has requested you wear a ZIO patch monitor for 14 days.  This is a single patch monitor. [employer] supplies one patch monitor per enrollment. Additional stickers are not available. Please do not apply patch if you will be having a Nuclear Stress Test,  Echocardiogram, Cardiac CT, MRI, or Chest Xray during the period you would be wearing the  monitor. The patch cannot be worn during these tests. You cannot remove and re-apply the  ZIO XT patch monitor.  Your ZIO patch monitor will be mailed 3 day USPS to your address on file. It may take 3-5 days  to receive your monitor after you have been enrolled.  Once you have received your monitor, please review the enclosed instructions. Your monitor  has already been registered assigning a specific monitor serial # to you.  Billing and Patient Assistance Program Information  We have supplied [employer] with any of your insurance information on file for billing purposes. [employer] offers a sliding scale Patient Assistance Program for patients that do not have  insurance, or whose insurance does not completely cover the cost of the ZIO monitor.  You must apply for the Patient Assistance Program to qualify for this discounted rate.  To apply, please call [employer] at [phone],  select option 4, select option 2, ask to apply for  Patient Assistance Program. Meredeth Ide will ask your household income, and how many people  are in your household. They will quote your out-of-pocket cost based on that information.  [employer] will also be able to set up a 28-month, interest-free payment plan if needed.  Applying the monitor   Shave hair from upper left chest.  Hold abrader disc by orange tab. Rub abrader in 40 strokes over the upper left chest as  indicated in your monitor instructions.  Clean area with 4 enclosed alcohol pads. Let dry.  Apply patch as indicated in monitor instructions. Patch will be placed under collarbone on left  side of chest with arrow pointing upward.  Rub patch adhesive wings for 2 minutes. Remove white label marked "1". Remove the white  label marked "2". Rub patch adhesive wings for 2 additional minutes.  While looking in a mirror, press and release button in center of patch. A small green light will  flash 3-4 times. This will be your only indicator that the monitor has been turned on.  Do not shower for the first 24 hours. You may shower after the first 24 hours.  Press the button if you feel a symptom. You will hear a small click. Record Date, Time and  Symptom in the Patient Logbook.  When you are ready to remove the patch, follow instructions on the last 2 pages of Patient  Logbook. Stick patch monitor onto the last page of Patient Logbook.  Place Patient Logbook  in the blue and white box. Use locking tab on box and tape box closed  securely. The blue and white box has prepaid postage on it. Please place it in the mailbox as  soon as possible. Your physician should have your test results approximately 7 days after the  monitor has been mailed back to [facility].  Call [facility] Customer Care at [phone] if you have questions regarding  your ZIO XT patch monitor. Call them immediately if you see an orange light blinking on your   monitor.  If your monitor falls off in less than 4 days, contact our Monitor department at [phone].  If your monitor becomes loose or falls off after 4 days call [employer] at [phone] for  suggestions on securing your monitor    Your cardiac CT will be scheduled at one of the below locations:   [facility] [address] [place], [place] [address] [phone]  If scheduled at [facility], please arrive at the [facility] and [facility] Entrance (Entrance C2) of [facility] 30 minutes prior to test start time. You can use the FREE valet parking offered at entrance C (encouraged to control the heart rate for the test)  Proceed to the [facility] Radiology Department (first floor) to check-in and test prep.  All radiology patients and guests should use entrance C2 at [facility], accessed from [facility], even though the hospital's physical address listed is [address].     Please follow these instructions carefully (unless otherwise directed):   On the Night Before the Test: Be sure to Drink plenty of water. Do not consume any caffeinated/decaffeinated beverages or chocolate 12 hours prior to your test. Do not take any antihistamines 12 hours prior to your test.  On the Day of the Test: Drink plenty of water until 1 hour prior to the test. Do not eat any food 4 hours prior to the test. You may take your regular medications prior to the test.  Take metoprolol (Lopressor) two hours prior to test. HOLD Furosemide/Hydrochlorothiazide morning of the test. FEMALES- please wear underwire-free bra if available, avoid dresses & tight clothing      After the Test: Drink plenty of water. After receiving IV contrast, you may experience a mild flushed feeling. This is normal. On occasion, you may experience a mild rash up to 24 hours after the test. This is not dangerous. If this occurs, you can take Benadryl 25 mg and  increase your fluid intake. If you experience trouble breathing, this can be serious. If it is severe call 911 IMMEDIATELY. If it is mild, please call our office. If you take any of these medications: Glipizide/Metformin, Avandament, Glucavance, please do not take 48 hours after completing test unless otherwise instructed.  We will call to schedule your test 2-4 weeks out understanding that some insurance companies will need an authorization prior to the service being performed.   For non-scheduling related questions, please contact the cardiac imaging nurse navigator should you have any questions/concerns: Rockwell Alexandria, Cardiac Imaging Nurse Navigator Larey Brick, Cardiac Imaging Nurse Navigator [facility] Direct Office Dial: [phone]   For scheduling needs, including cancellations and rescheduling, please call [place], [phone].    Follow-Up: At [facility], you and your health needs are our priority.  As part of our continuing mission to provide you with exceptional heart care, we have created designated Provider Care Teams.  These Care Teams include  your primary Cardiologist (physician) and Advanced Practice Providers (APPs -  Physician Assistants and Nurse Practitioners) who all work together to provide you with the care you need, when you need it.  We recommend signing up for the patient portal called "MyChart".  Sign up information is provided on this After Visit Summary.  MyChart is used to connect with patients for Virtual Visits (Telemedicine).  Patients are able to view lab/test results, encounter notes, upcoming appointments, etc.  Non-urgent messages can be sent to your provider as well.   To learn more about what you can do with MyChart, go to [email or web address].    Your next appointment:   After CT scan  The format for your next appointment:   In Person  Provider:   Peter Martinique, MD     Other  Instructions   Important Information About Sugar

## 2022-04-03 NOTE — Addendum Note (Signed)
Addended by: Orlene Och on: 04/03/2022 11:37 AM   Modules accepted: Orders

## 2022-04-04 DIAGNOSIS — R079 Chest pain, unspecified: Secondary | ICD-10-CM

## 2022-04-04 DIAGNOSIS — R42 Dizziness and giddiness: Secondary | ICD-10-CM | POA: Diagnosis not present

## 2022-04-22 ENCOUNTER — Telehealth (HOSPITAL_COMMUNITY): Payer: Self-pay | Admitting: *Deleted

## 2022-04-22 NOTE — Telephone Encounter (Signed)
Attempted to call patient regarding upcoming cardiac CT appointment. °Left message on voicemail with name and callback number ° °Kessa Fairbairn RN Navigator Cardiac Imaging °[facility] °[phone] Office °[phone] Cell ° °

## 2022-04-23 ENCOUNTER — Ambulatory Visit (HOSPITAL_COMMUNITY)
Admission: RE | Admit: 2022-04-23 | Discharge: 2022-04-23 | Disposition: A | Discharge status: Home / Self Care | Payer: Medicare Other | Source: Ambulatory Visit | Attending: Cardiology | Admitting: Cardiology

## 2022-04-23 DIAGNOSIS — R079 Chest pain, unspecified: Secondary | ICD-10-CM | POA: Diagnosis not present

## 2022-04-23 DIAGNOSIS — I2510 Atherosclerotic heart disease of native coronary artery without angina pectoris: Secondary | ICD-10-CM | POA: Diagnosis not present

## 2022-04-23 MED ORDER — IOHEXOL 350 MG/ML SOLN
100.0000 mL | Freq: Once | INTRAVENOUS | Status: AC | PRN
  Administered 2022-04-23: 100 mL via INTRAVENOUS

## 2022-04-23 MED ORDER — NITROGLYCERIN 0.4 MG SL SUBL: 0.8000 mg | SUBLINGUAL_TABLET | Freq: Once | SUBLINGUAL | Status: AC

## 2022-04-23 MED ORDER — NITROGLYCERIN 0.4 MG SL SUBL
SUBLINGUAL_TABLET | SUBLINGUAL | Status: AC
  Administered 2022-04-23: 0.8 mg via SUBLINGUAL
  Filled 2022-04-23: qty 2

## 2022-04-24 ENCOUNTER — Other Ambulatory Visit: Payer: Self-pay

## 2022-04-24 DIAGNOSIS — I471 Supraventricular tachycardia: Secondary | ICD-10-CM

## 2022-04-24 DIAGNOSIS — R42 Dizziness and giddiness: Secondary | ICD-10-CM

## 2022-04-24 MED ORDER — METOPROLOL SUCCINATE ER 50 MG PO TB24: 50.0000 mg | ORAL_TABLET | Freq: Every day | ORAL | 3 refills | Status: DC

## 2022-05-15 ENCOUNTER — Ambulatory Visit (HOSPITAL_COMMUNITY): Payer: Medicare Other | Attending: Cardiology

## 2022-05-15 DIAGNOSIS — I340 Nonrheumatic mitral (valve) insufficiency: Secondary | ICD-10-CM | POA: Diagnosis not present

## 2022-05-15 DIAGNOSIS — R079 Chest pain, unspecified: Secondary | ICD-10-CM | POA: Diagnosis not present

## 2022-05-15 DIAGNOSIS — R9431 Abnormal electrocardiogram [ECG] [EKG]: Secondary | ICD-10-CM | POA: Diagnosis not present

## 2022-05-15 DIAGNOSIS — I351 Nonrheumatic aortic (valve) insufficiency: Secondary | ICD-10-CM | POA: Diagnosis not present

## 2022-05-15 DIAGNOSIS — I10 Essential (primary) hypertension: Secondary | ICD-10-CM | POA: Diagnosis not present

## 2022-05-15 DIAGNOSIS — I471 Supraventricular tachycardia, unspecified: Secondary | ICD-10-CM | POA: Insufficient documentation

## 2022-05-15 DIAGNOSIS — R42 Dizziness and giddiness: Secondary | ICD-10-CM | POA: Insufficient documentation

## 2022-05-15 DIAGNOSIS — Z8249 Family history of ischemic heart disease and other diseases of the circulatory system: Secondary | ICD-10-CM | POA: Insufficient documentation

## 2022-05-15 LAB — ECHOCARDIOGRAM COMPLETE
Area-P 1/2: 3.69 cm2
S' Lateral: 2.25 cm

## 2022-05-19 NOTE — Progress Notes (Unsigned)
Cardiology Office Note:    Date:  05/21/2022   ID:  Silvestre Mesi, DOB [birth year], MRN [id number]  PCP:  Kathyrn Lass   [place] [facility] Cardiologist:  Yulian Gosney Martinique, MD     Referring MD: No ref. provider found   Chief Complaint  Patient presents with   Palpitations   Dizziness    History of Present Illness:    Maureen Jimenez is a 81 y.o. female is seen for follow up of  lightheadedness and chest pain. I take care of her husband Buddy. She in general has been in excellent health. Was apparently on BP medication in the past but this was stopped. She was seen for symptoms of lightheadedness. This may occur 2-3 times a week. May occur anytime. Lasts several seconds. Has not passed out. No palpitations. In addition she has experienced symptoms of chest pain. This begins in the mid chest and radiates into the back and arms R>L. Described as sharp. No SOB. Does have occasional cough. Family history is positive for stroke and some heart disease. Has 12 brothers and sisters.   She wore an event monitor which showed runs of SVT which correlated with her symptoms. She was started on Toprol XL 50 mg daily. Coronary calcium score was low at 15. 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 Echo was unremarkable.   Since starting on Toprol she states she hasn't had any further episodes of lightheadedness. Feels well. Tolerating medication well.  Past Medical History:  Diagnosis Date   Hypertension    Kidney stones     Past Surgical History:  Procedure Laterality Date   ABDOMINAL HYSTERECTOMY     ANKLE SURGERY     CHOLECYSTECTOMY      Current Medications: No outpatient medications have been marked as taking for the 05/21/22 encounter (Office Visit) with Martinique, Lavida Patch M, MD.     Allergies:   Patient has no known allergies.   Social History   Socioeconomic History   Marital status: Married    Spouse name: Not on file   Number of children: 2   Years of education: Not on file    Highest education level: Not on file  Occupational History   Not on file  Tobacco Use   Smoking status: Never   Smokeless tobacco: Never  Substance and Sexual Activity   Alcohol use: No   Drug use: No   Sexual activity: Not on file  Other Topics Concern   Not on file  Social History Narrative   Not on file   Social Determinants of Health   Financial Resource Strain: Not on file  Food Insecurity: Not on file  Transportation Needs: Not on file  Physical Activity: Not on file  Stress: Not on file  Social Connections: Not on file     Family History: The patient's family history includes Breast cancer in her sister; Cancer - Lung in her brother; Heart failure in her mother; Stroke in her brother, brother, and father.  ROS:   Please see the history of present illness.     All other systems reviewed and are negative.  EKGs/Labs/Other Studies Reviewed:    The following studies were reviewed today: None  Event monitor: 04/24/22: Study Highlights      Normal sinus rhythm   8 runs of SVT longest 35 seconds. symptoms correlate with SVT   Otherwise rare PACs, PVCs, couplets.     Patch Wear Time:  14 days and 0 hours (2023-09-02T17:06:39-0400 to 2023-09-16T17:06:43-0400)  Patient had a min HR of 63 bpm, max HR of 266 bpm, and avg HR of 92 bpm. Predominant underlying rhythm was Sinus Rhythm. 8 Supraventricular Tachycardia runs occurred, the run with the fastest interval lasting 35.1 secs with a max rate of 266 bpm, the  longest lasting 35.9 secs with an avg rate of 160 bpm. Supraventricular Tachycardia was detected within +/- 45 seconds of symptomatic patient event(s). Isolated SVEs were rare (<1.0%), SVE Couplets were rare (<1.0%), and SVE Triplets were rare (<1.0%).  Isolated VEs were rare (<1.0%, 171), VE Triplets were rare (<1.0%, 1), and no VE Couplets were present.   Coronary CTA: 04/23/22:  OVER-READ INTERPRETATION  CT CHEST   The following report is an over-read  performed by radiologist Dr. Marinda Elk [facility] on 04/23/2022. This over-read does not include interpretation of cardiac or coronary anatomy or pathology. The coronary CTA interpretation by the cardiologist is attached.   COMPARISON:  None.   FINDINGS: No significant noncardiac vascular findings. Visualized mediastinum and hilar regions demonstrate no lymphadenopathy or masses. Visualized lungs show no evidence of pulmonary edema, consolidation, pneumothorax, nodule or pleural fluid. Visualized upper abdomen and bony structures are unremarkable.   IMPRESSION: No significant incidental findings.     Electronically Signed   By: Irish Lack M.D.   On: 04/23/2022 14:53    Addended by Irish Lack, MD on 04/23/2022  2:55 PM   Study Result  Narrative & Impression  CLINICAL DATA:  This is a 81 year old female with anginal symptoms.   EXAM: Cardiac/Coronary  CTA   TECHNIQUE: The patient was scanned on a [employer].   FINDINGS: A 100 kV prospective scan was triggered in the descending thoracic aorta at 111 HU's. Axial non-contrast 3 mm slices were carried out through the heart. The data set was analyzed on a dedicated work station and scored using the Agatson method. Gantry rotation speed was 250 msecs and collimation was .6 mm. No beta blockade and 0.8 mg of sl NTG was given. The 3D data set was reconstructed in 5% intervals of the 67-82 % of the R-R cycle. Diastolic phases were analyzed on a dedicated work station using MPR, MIP and VRT modes. The patient received 80 cc of contrast.   Image Quality: Fair, with misregistration artifact.   Aorta: Normal size.  No calcifications.  No dissection.   Aortic Valve:  Trileaflet.  No calcifications.   Coronary Arteries:  Normal coronary origin.  CO- dominance.   RCA is a tortuous medium sized dominant artery that gives rise to PDA and PLA. There is no plaque. There is misregistration  artifact in the proximal portion making the portion poorly visible- no apparent plaque.   Left main is a large artery that gives rise to LAD and LCX arteries. The proximal LAD with mild (24-49%) calcified plaque. The mid and distal LAD with no plaques.   LAD is a large vessel that has no plaque.   LCX is a CO-dominant artery that gives rise to one large OM1 branch. There is no plaque.   Coronary Calcium Score:   Left main: 0   Left anterior descending artery: 14.8   Left circumflex artery: 0   Right coronary artery: 0   Total: 14.8   Percentile: 6   Other findings:   Normal pulmonary vein drainage into the left atrium.   Normal left atrial appendage without a thrombus.   Normal size of the pulmonary artery.   Mild mitral annular  calcification.   IMPRESSION: 1. Coronary calcium score of 14.8. This was 6 percentile for age and sex matched control.   2. Normal coronary origin with co-dominance.   3. CAD-RADS 2. Mild non-obstructive CAD (25-49%). Consider non-atherosclerotic causes of chest pain. Consider preventive therapy and risk factor modification.   The noncardiac portion of this study will be interpreted in separate report by the radiologist.   Electronically Signed: By: Thomasene Ripple D.O. On: 04/23/2022 13:13    Echo 05/15/22: IMPRESSIONS     1. Left ventricular ejection fraction, by estimation, is 60 to 65%. The  left ventricle has normal function. The left ventricle has no regional  wall motion abnormalities. There is mild asymmetric left ventricular  hypertrophy of the basal-septal segment.  Left ventricular diastolic parameters are consistent with Grade I  diastolic dysfunction (impaired relaxation).   2. Right ventricular systolic function is normal. The right ventricular  size is normal. There is normal pulmonary artery systolic pressure.   3. Left atrial size was moderately dilated.   4. Right atrial size was mildly dilated.   5. The  mitral valve is normal in structure. No evidence of mitral valve  regurgitation. No evidence of mitral stenosis.   6. The aortic valve is tricuspid. There is mild calcification of the  aortic valve. Aortic valve regurgitation is mild. No aortic stenosis is  present.   7. The inferior vena cava is normal in size with greater than 50%  respiratory variability, suggesting right atrial pressure of 3 mmHg.   EKG:  EKG is not  ordered today.     Recent Labs: 04/02/2022: BUN 12; Creatinine, Ser 1.01; Potassium 5.0; Sodium 141  Recent Lipid Panel No results found for: "CHOL", "TRIG", "HDL", "CHOLHDL", "VLDL", "LDLCALC", "LDLDIRECT"  Dated 03/03/22: cholesterol 139, triglycerides 41, HDL 64, LDL 64. A1c 5.8%. CMET, CBC, TSH normal.  Risk Assessment/Calculations:      HYPERTENSION CONTROL Vitals:   05/21/22 0831 05/21/22 0859  BP: (!) 142/86 (!) 144/70    The patient's blood pressure is elevated above target today.  In order to address the patient's elevated BP: Blood pressure will be monitored at home to determine if medication changes need to be made.            Physical Exam:    VS:  BP (!) 144/70 (BP Location: Right Arm, Patient Position: Sitting)   Pulse 65   Ht 5\' 4"  (1.626 m)   Wt 177 lb (80.3 kg)   SpO2 97%   BMI 30.38 kg/m     Wt Readings from Last 3 Encounters:  05/21/22 177 lb (80.3 kg)  04/02/22 176 lb (79.8 kg)     GEN:  Well nourished, well developed in no acute distress HEENT: Normal NECK: No JVD; No carotid bruits LYMPHATICS: No lymphadenopathy CARDIAC: RRR, no murmurs, rubs, gallops RESPIRATORY:  Clear to auscultation without rales, wheezing or rhonchi  ABDOMEN: Soft, non-tender, non-distended MUSCULOSKELETAL:  No edema; No deformity  SKIN: Warm and dry NEUROLOGIC:  Alert and oriented x 3 PSYCHIATRIC:  Normal affect   ASSESSMENT:    1. SVT (supraventricular tachycardia)     PLAN:    In order of problems listed above:  SVT Chest pain.  Etiology unclear. Low risk coronary CTA with nonobstructive CAD. Continue risk factor modification Elevated BP without diagnosis of HTN. BP better on Toprol XL. Did not take medication today. Will monitor BP at home and report if it is staying high.  Medication Adjustments/Labs and Tests Ordered: Current medicines are reviewed at length with the patient today.  Concerns regarding medicines are outlined above.  No orders of the defined types were placed in this encounter.  No orders of the defined types were placed in this encounter.   There are no Patient Instructions on file for this visit.    I will follow up in one year  Signed, Nyja Westbrook [place], MD  05/21/2022 9:00 AM    [facility]

## 2022-05-21 ENCOUNTER — Ambulatory Visit: Payer: Medicare Other | Admitting: Cardiology

## 2022-05-21 ENCOUNTER — Ambulatory Visit: Payer: Medicare Other | Attending: Cardiology | Admitting: Cardiology

## 2022-05-21 ENCOUNTER — Encounter: Payer: Self-pay | Admitting: Cardiology

## 2022-05-21 DIAGNOSIS — I471 Supraventricular tachycardia, unspecified: Secondary | ICD-10-CM | POA: Diagnosis not present

## 2024-01-05 ENCOUNTER — Other Ambulatory Visit: Payer: Self-pay | Admitting: Cardiology

## 2024-06-27 ENCOUNTER — Other Ambulatory Visit: Payer: Self-pay | Admitting: Cardiology

## 2024-06-27 NOTE — Telephone Encounter (Signed)
 Patient need make an overdue appointment with  Jordan, Peter for further refills.  [phone]

## 2024-09-06 ENCOUNTER — Other Ambulatory Visit: Payer: Self-pay | Admitting: Cardiology
